# Patient Record
Sex: Male | Born: 1965 | Race: White | Hispanic: No | Marital: Married | Smoking: Never smoker
Health system: Southern US, Community
[De-identification: ages and names within clinical notes are randomized; demographics above are authoritative.]

## PROBLEM LIST (undated history)

## (undated) DIAGNOSIS — M549 Dorsalgia, unspecified: Secondary | ICD-10-CM

## (undated) HISTORY — DX: Dorsalgia, unspecified: M54.9

## (undated) HISTORY — PX: MOUTH SURGERY: SHX715

---

## 1998-12-25 ENCOUNTER — Encounter: Admission: RE | Admit: 1998-12-25 | Discharge: 1998-12-25 | Payer: Self-pay | Admitting: Neurological Surgery

## 1998-12-25 ENCOUNTER — Encounter: Payer: Self-pay | Admitting: Neurological Surgery

## 2005-10-08 ENCOUNTER — Encounter: Admission: RE | Admit: 2005-10-08 | Discharge: 2005-10-08 | Payer: Self-pay | Admitting: Orthopaedic Surgery

## 2009-12-28 ENCOUNTER — Emergency Department (HOSPITAL_BASED_OUTPATIENT_CLINIC_OR_DEPARTMENT_OTHER): Admission: EM | Admit: 2009-12-28 | Discharge: 2009-12-28 | Payer: Self-pay | Admitting: Emergency Medicine

## 2009-12-28 ENCOUNTER — Ambulatory Visit: Payer: Self-pay | Admitting: Interventional Radiology

## 2010-05-05 LAB — COMPREHENSIVE METABOLIC PANEL
ALT: 22 U/L (ref 0–53)
AST: 19 U/L (ref 0–37)
Albumin: 4 g/dL (ref 3.5–5.2)
Alkaline Phosphatase: 52 U/L (ref 39–117)
BUN: 18 mg/dL (ref 6–23)
CO2: 28 mEq/L (ref 19–32)
Calcium: 9.6 mg/dL (ref 8.4–10.5)
Chloride: 106 mEq/L (ref 96–112)
Creatinine, Ser: 1.1 mg/dL (ref 0.4–1.5)
GFR calc Af Amer: >60 mL/min (ref >60)
GFR calc non Af Amer: >60 mL/min (ref >60)
Glucose, Bld: 85 mg/dL (ref 70–99)
Potassium: 4.4 mEq/L (ref 3.5–5.1)
Sodium: 144 mEq/L (ref 135–145)
Total Bilirubin: 0.6 mg/dL (ref 0.3–1.2)
Total Protein: 6.8 g/dL (ref 6.0–8.3)

## 2010-05-05 LAB — POCT CARDIAC MARKERS
CKMB, poc: <1 ng/mL — ABNORMAL LOW (ref 1.0–8.0)
Myoglobin, poc: 36.4 ng/mL (ref 12–200)
Troponin i, poc: <0.05 ng/mL (ref 0.00–0.09)
Troponin i, poc: <0.05 ng/mL (ref 0.00–0.09)

## 2010-05-05 LAB — CBC
HCT: 41.2 % (ref 39.0–52.0)
Hemoglobin: 14.4 g/dL (ref 13.0–17.0)
MCH: 31 pg (ref 26.0–34.0)
MCHC: 34.9 g/dL (ref 30.0–36.0)
MCV: 88.8 fL (ref 78.0–100.0)
Platelets: 110 10*3/uL — ABNORMAL LOW (ref 150–400)
RBC: 4.65 MIL/uL (ref 4.22–5.81)
RDW: 12.4 % (ref 11.5–15.5)
WBC: 5.5 10*3/uL (ref 4.0–10.5)

## 2011-08-22 IMAGING — CR DG CHEST 2V
2 series · 2 of 2 positions shown · non-contrast
Comparison: None

CLINICAL DATA: Chest pain and left arm pain.

CHEST - 2 VIEW

[w chest pa]
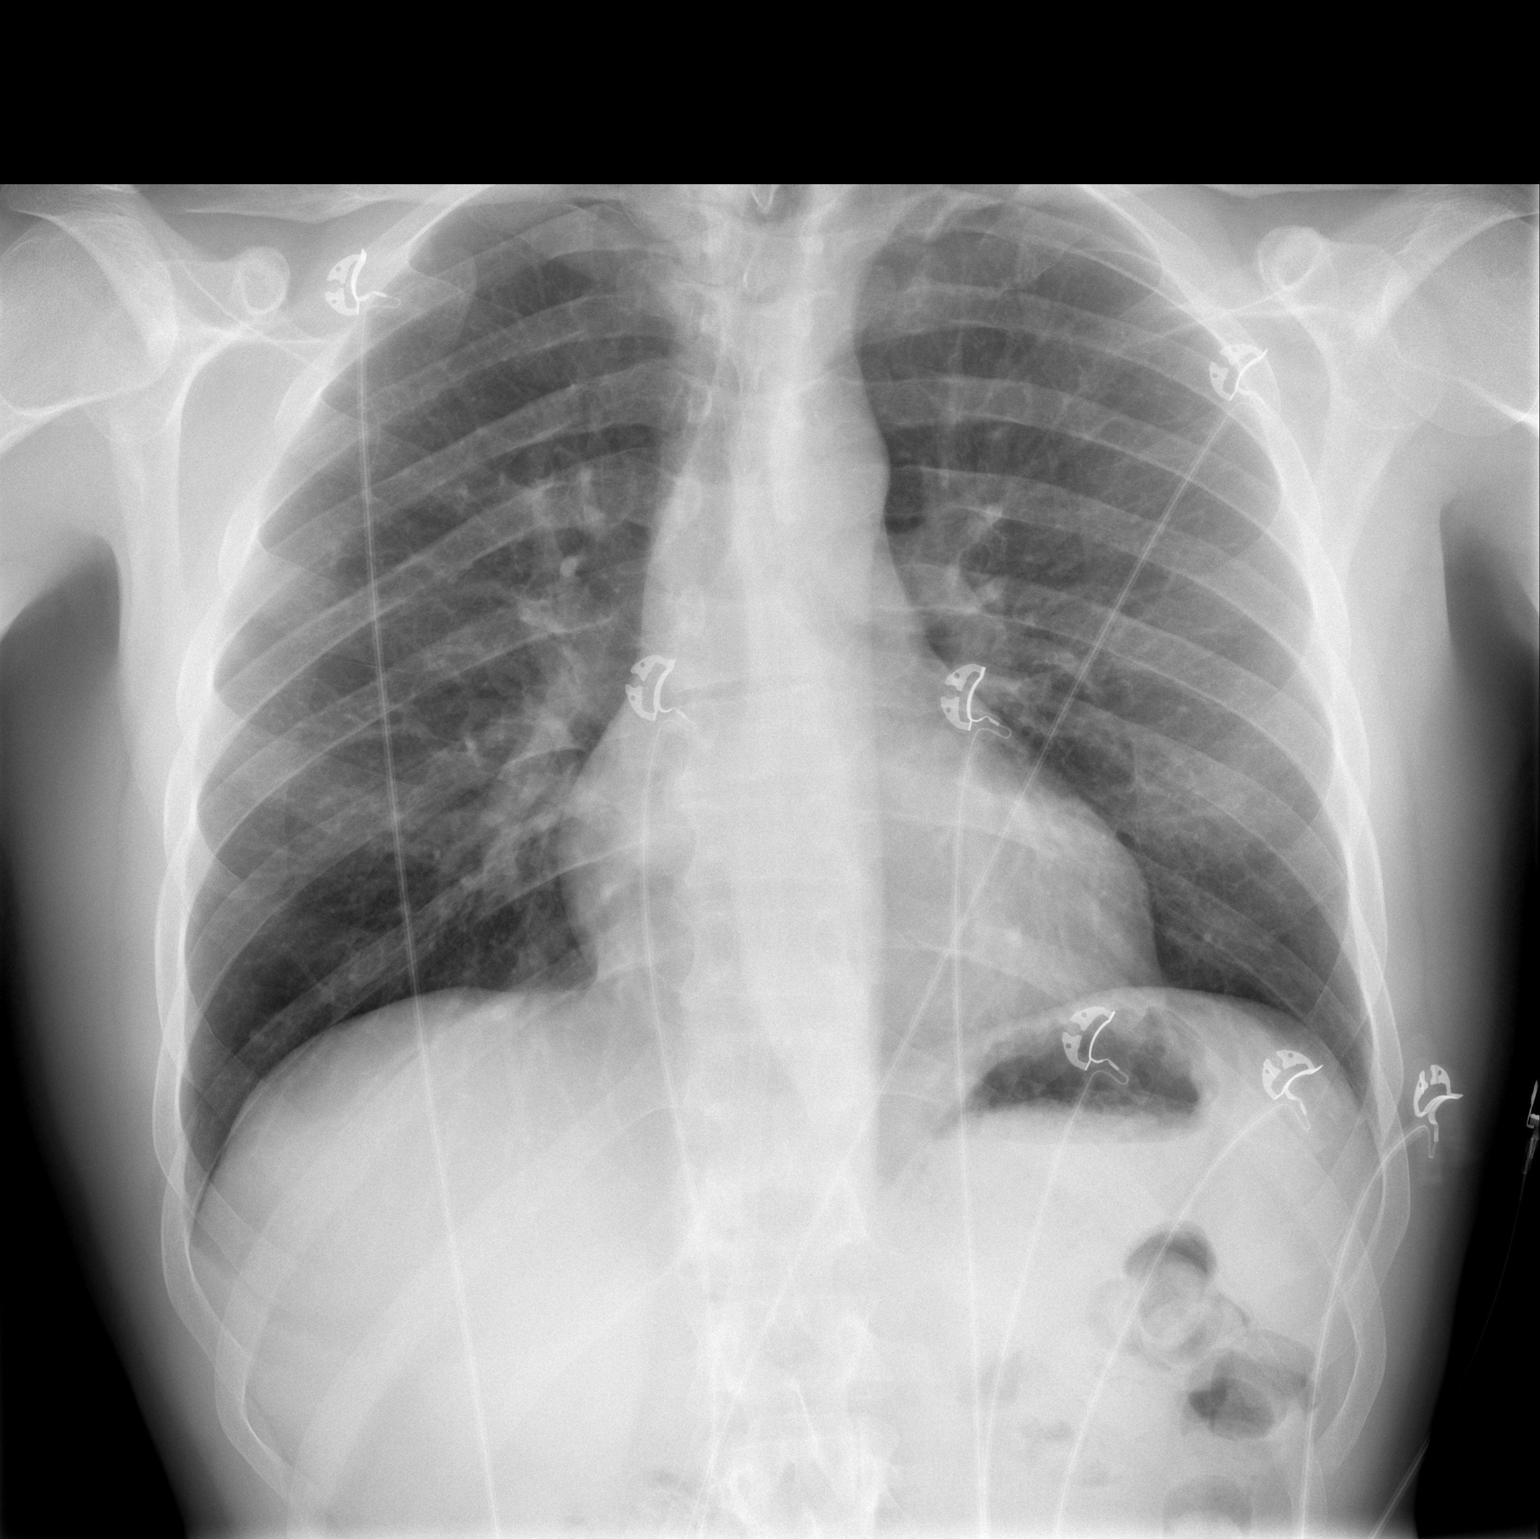

[w chest lat]
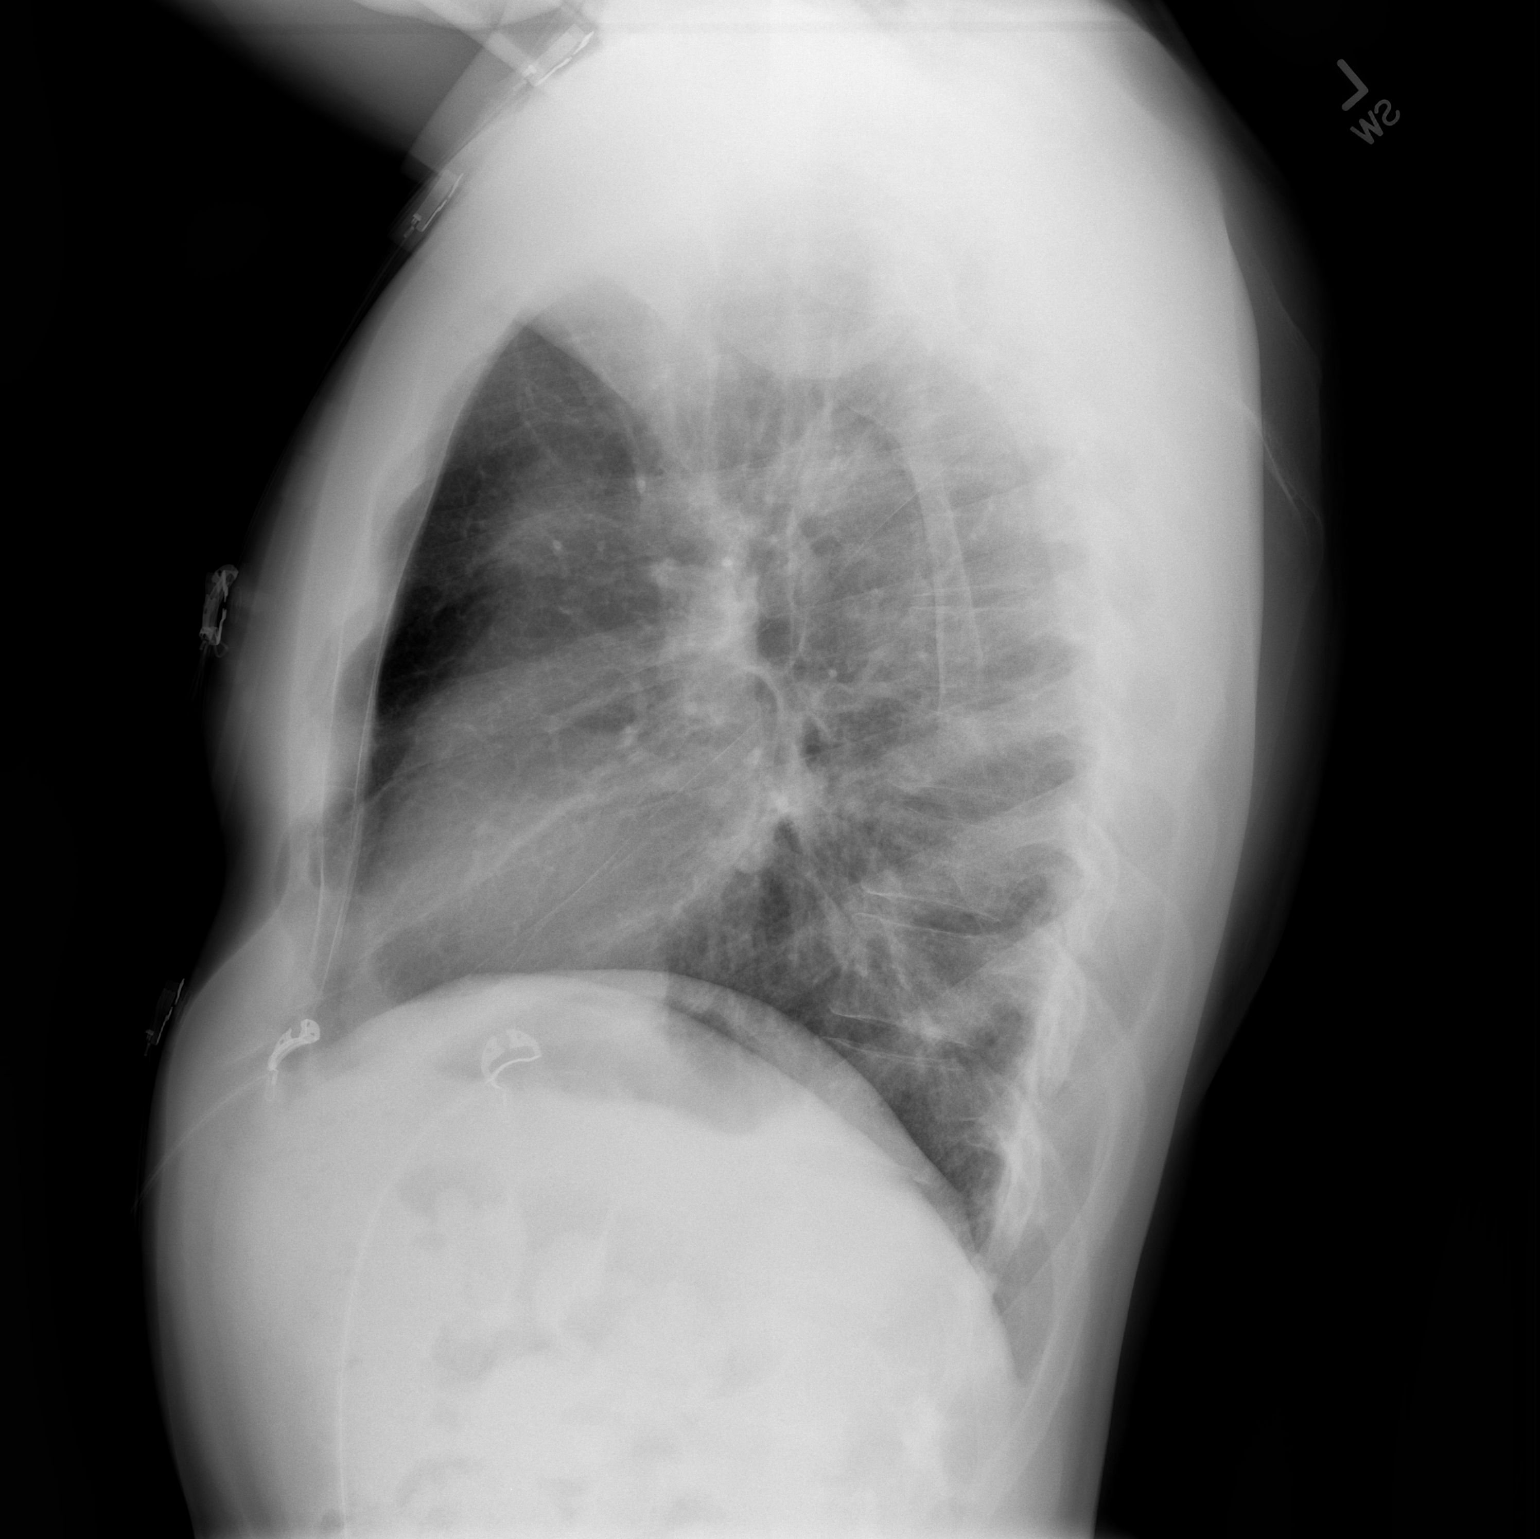

[2 of 2 positions shown; findings below may reference images not displayed]

FINDINGS: The heart size and mediastinal contours are within normal
limits.  Both lungs are clear.  The visualized skeletal structures
are unremarkable.
IMPRESSION: No active disease.

## 2016-06-30 ENCOUNTER — Ambulatory Visit (INDEPENDENT_AMBULATORY_CARE_PROVIDER_SITE_OTHER): Payer: BLUE CROSS/BLUE SHIELD | Admitting: Emergency Medicine

## 2016-06-30 ENCOUNTER — Encounter: Payer: Self-pay | Admitting: Emergency Medicine

## 2016-06-30 VITALS — BP 122/75 | HR 63 | Temp 98.3°F | Resp 17 | Ht 70.5 in | Wt 188.0 lb

## 2016-06-30 DIAGNOSIS — Z119 Encounter for screening for infectious and parasitic diseases, unspecified: Secondary | ICD-10-CM | POA: Insufficient documentation

## 2016-06-30 DIAGNOSIS — Z111 Encounter for screening for respiratory tuberculosis: Secondary | ICD-10-CM | POA: Insufficient documentation

## 2016-06-30 LAB — POC INFLUENZA A&B (BINAX/QUICKVUE)
INFLUENZA A, POC: NEGATIVE
Influenza B, POC: NEGATIVE

## 2016-06-30 NOTE — Patient Instructions (Signed)
     IF you received an x-ray today, you will receive an invoice from [facility]. Please contact [facility] at [phone] with questions or concerns regarding your invoice.   IF you received labwork today, you will receive an invoice from [facility]. Please contact [facility] at [phone] with questions or concerns regarding your invoice.   Our billing staff will not be able to assist you with questions regarding bills from these companies.  You will be contacted with the lab results as soon as they are available. The fastest way to get your results is to activate your My Chart account. Instructions are located on the last page of this paperwork. If you have not heard from us regarding the results in 2 weeks, please contact this office.     

## 2016-06-30 NOTE — Progress Notes (Signed)
David Adams 51 y.o.   Chief Complaint  Patient presents with  . 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 Asymptomatic and no medical concerns.  HPI   Prior to Admission medications   Not on File    Not on File  There are no active problems to display for this patient.   No past medical history on file.  No past surgical history on file.  Social History   Social History  . Marital status: Single    Spouse name: N/A  . Number of children: N/A  . Years of education: N/A   Occupational History  . Not on file.   Social History Main Topics  . Smoking status: Never Smoker  . Smokeless tobacco: Never Used  . Alcohol use No  . Drug use: No  . Sexual activity: No   Other Topics Concern  . Not on file   Social History Narrative  . No narrative on file    No family history on file.   Review of Systems  Constitutional: Negative.  Negative for chills, fever and weight loss.  Respiratory: Negative for cough.   Gastrointestinal: Negative for nausea and vomiting.  Skin: Negative for rash.  Neurological: Negative for dizziness and headaches.  All other systems reviewed and are negative.  Vitals:   06/30/16 1606  BP: 122/75  Pulse: 63  Resp: 17  Temp: 98.3 F (36.8 C)     Physical Exam  Constitutional: He is oriented to person, place, and time. He appears well-developed and well-nourished.  HENT:  Head: Normocephalic and atraumatic.  Eyes: EOM are normal. Pupils are equal, round, and reactive to light.  Neck: Normal range of motion.  Cardiovascular: Normal rate.   Pulmonary/Chest: Effort normal.  Musculoskeletal: Normal range of motion.  Neurological: He is alert and oriented to person, place, and time.  Skin: Skin is warm and dry.  Psychiatric: He has a normal mood and affect. His  behavior is normal.  Vitals reviewed.    ASSESSMENT & PLAN: Acel was seen today for needs tb screening and test for influenza.  Diagnoses and all orders for this visit:  Screening-pulmonary TB -     TB Skin Test  Screening examination for infectious disease -     POC Influenza A&B(BINAX/QUICKVUE)      Agustina Caroli, MD [facility]

## 2016-06-30 NOTE — Progress Notes (Signed)

## 2016-07-02 ENCOUNTER — Ambulatory Visit (INDEPENDENT_AMBULATORY_CARE_PROVIDER_SITE_OTHER): Payer: BLUE CROSS/BLUE SHIELD | Admitting: Physician Assistant

## 2016-07-02 DIAGNOSIS — Z111 Encounter for screening for respiratory tuberculosis: Secondary | ICD-10-CM

## 2016-07-02 LAB — TB SKIN TEST
Induration: 0 mm
TB Skin Test: NEGATIVE

## 2017-05-25 ENCOUNTER — Encounter: Payer: Self-pay | Admitting: Physician Assistant

## 2017-12-13 DIAGNOSIS — Z23 Encounter for immunization: Secondary | ICD-10-CM | POA: Diagnosis not present

## 2017-12-13 DIAGNOSIS — Z7189 Other specified counseling: Secondary | ICD-10-CM | POA: Diagnosis not present

## 2017-12-13 DIAGNOSIS — Z0000 Encounter for general adult medical examination without abnormal findings: Secondary | ICD-10-CM | POA: Diagnosis not present

## 2019-01-25 ENCOUNTER — Ambulatory Visit (INDEPENDENT_AMBULATORY_CARE_PROVIDER_SITE_OTHER): Payer: Self-pay | Admitting: Family Medicine

## 2019-01-25 ENCOUNTER — Encounter: Payer: Self-pay | Admitting: Gastroenterology

## 2019-01-25 ENCOUNTER — Other Ambulatory Visit: Payer: Self-pay

## 2019-01-25 ENCOUNTER — Encounter: Payer: Self-pay | Admitting: Family Medicine

## 2019-01-25 VITALS — BP 120/81 | HR 51 | Temp 98.2°F | Ht 69.5 in | Wt 197.2 lb

## 2019-01-25 DIAGNOSIS — Z23 Encounter for immunization: Secondary | ICD-10-CM | POA: Diagnosis not present

## 2019-01-25 DIAGNOSIS — H9313 Tinnitus, bilateral: Secondary | ICD-10-CM | POA: Diagnosis not present

## 2019-01-25 DIAGNOSIS — Z1211 Encounter for screening for malignant neoplasm of colon: Secondary | ICD-10-CM | POA: Diagnosis not present

## 2019-01-25 DIAGNOSIS — Z6828 Body mass index (BMI) 28.0-28.9, adult: Secondary | ICD-10-CM

## 2019-01-25 DIAGNOSIS — E663 Overweight: Secondary | ICD-10-CM

## 2019-01-25 DIAGNOSIS — Z125 Encounter for screening for malignant neoplasm of prostate: Secondary | ICD-10-CM | POA: Diagnosis not present

## 2019-01-25 DIAGNOSIS — Z0001 Encounter for general adult medical examination with abnormal findings: Secondary | ICD-10-CM

## 2019-01-25 LAB — CBC
HCT: 44.3 % (ref 39.0–52.0)
Hemoglobin: 14.8 g/dL (ref 13.0–17.0)
MCHC: 33.4 g/dL (ref 30.0–36.0)
MCV: 89.1 fl (ref 78.0–100.0)
Platelets: 140 10*3/uL — ABNORMAL LOW (ref 150.0–400.0)
RBC: 4.98 Mil/uL (ref 4.22–5.81)
RDW: 13.5 % (ref 11.5–15.5)
WBC: 5 10*3/uL (ref 4.0–10.5)

## 2019-01-25 LAB — LIPID PANEL
Cholesterol: 176 mg/dL (ref 0–200)
HDL: 53.3 mg/dL (ref >39.00)
LDL Cholesterol: 104 mg/dL — ABNORMAL HIGH (ref 0–99)
NonHDL: 122.7
Total CHOL/HDL Ratio: 3
Triglycerides: 93 mg/dL (ref 0.0–149.0)
VLDL: 18.6 mg/dL (ref 0.0–40.0)

## 2019-01-25 LAB — SARS-COV-2 IGG: SARS-COV-2 IgG: 0.04

## 2019-01-25 LAB — COMPREHENSIVE METABOLIC PANEL
ALT: 23 U/L (ref 0–53)
AST: 16 U/L (ref 0–37)
Albumin: 4.4 g/dL (ref 3.5–5.2)
Alkaline Phosphatase: 58 U/L (ref 39–117)
BUN: 16 mg/dL (ref 6–23)
CO2: 31 mEq/L (ref 19–32)
Calcium: 11.7 mg/dL — ABNORMAL HIGH (ref 8.4–10.5)
Chloride: 104 mEq/L (ref 96–112)
Creatinine, Ser: 1.08 mg/dL (ref 0.40–1.50)
GFR: 71.02 mL/min (ref >60.00)
Glucose, Bld: 92 mg/dL (ref 70–99)
Potassium: 4.8 mEq/L (ref 3.5–5.1)
Sodium: 141 mEq/L (ref 135–145)
Total Bilirubin: 0.4 mg/dL (ref 0.2–1.2)
Total Protein: 6.6 g/dL (ref 6.0–8.3)

## 2019-01-25 LAB — TSH: TSH: 2.56 u[IU]/mL (ref 0.35–4.50)

## 2019-01-25 LAB — PSA: PSA: 2.57 ng/mL (ref 0.10–4.00)

## 2019-01-25 NOTE — Patient Instructions (Signed)
It was very nice to see you today!  You have tinnitus. Please let me know when you would like referral to an audiologist.  We will give you a flu vaccine today.  We will check blood work today.  Come back in 1 year for your next physical, or sooner if needed.  Take care, Dr Jerline Pain  Please try these tips to maintain a healthy lifestyle:   Eat at least 3 REAL meals and 1-2 snacks per day.  Aim for no more than 5 hours between eating.  If you eat breakfast, please do so within one hour of getting up.    Obtain twice as many fruits/vegetables as protein or carbohydrate foods for both lunch and dinner. (Half of each meal should be fruits/vegetables, one quarter protein, and one quarter starchy carbs)   Cut down on sweet beverages. This includes juice, soda, and sweet tea.    Exercise at least 150 minutes every week.    Tinnitus Tinnitus refers to hearing a sound when there is no actual source for that sound. This is often described as ringing in the ears. However, people with this condition may hear a variety of noises, in one ear or in both ears. The sounds of tinnitus can be soft, loud, or somewhere in between. Tinnitus can last for a few seconds or can be constant for days. It may go away without treatment and come back at various times. When tinnitus is constant or happens often, it can lead to other problems, such as trouble sleeping and trouble concentrating. Almost everyone experiences tinnitus at some point. Tinnitus that is long-lasting (chronic) or comes back often (recurs) may require medical attention. What are the causes? The cause of tinnitus is often not known. In some cases, it can result from other problems or conditions, including:  Exposure to loud noises from machinery, music, or other sources.  Hearing loss.  Ear or sinus infections.  Earwax buildup.  An object (foreign body) stuck in the ear.  Taking certain medicines.  Drinking alcohol or caffeine.   High blood pressure.  Heart diseases.  Anemia.  Allergies.  Meniere's disease.  Thyroid problems.  Tumors.  A weak, bulging blood vessel (aneurysm) near the ear.  Depression or other mood disorders. What are the signs or symptoms? The main symptom of tinnitus is hearing a sound when there is no source for that sound. It may sound like:  Buzzing.  Roaring.  Ringing.  Blowing air, like the sound heard when you listen to a seashell.  Hissing.  Whistling.  Sizzling.  Humming.  Running water.  A musical note.  Tapping. Symptoms may affect only one ear (unilateral) or both ears (bilateral). How is this diagnosed? Tinnitus is diagnosed based on your symptoms, your medical history, and a physical exam. Your health care provider may do a thorough hearing test (audiologic exam) if your tinnitus:  Is unilateral.  Causes hearing difficulties.  Lasts 6 months or longer. You may work with a health care provider who specializes in hearing disorders (audiologist). You may be asked questions about your symptoms and how they affect your daily life. You may have other tests done, such as:  CT scan.  MRI.  An imaging test of how blood flows through your blood vessels (angiogram). How is this treated? Treating an underlying medical condition can sometimes make tinnitus go away. If your tinnitus continues, other treatments may include:  Medicines, such as antidepressants or sleeping aids.  Sound generators to mask the tinnitus.  These include: ? Tabletop sound machines that play relaxing sounds to help you fall asleep. ? Wearable devices that fit in your ear and play sounds or music. ? Acoustic neural stimulation. This involves using headphones to listen to music that contains an auditory signal. Over time, listening to this signal may change some pathways in your brain and make you less sensitive to tinnitus. This treatment is used for very severe cases when no other  treatment is working.  Therapy and counseling to help you manage the stress of living with tinnitus.  Using hearing aids or cochlear implants if your tinnitus is related to hearing loss. Hearing aids are worn in the outer ear. Cochlear implants are surgically placed in the inner ear. Follow these instructions at home: Managing symptoms      When possible, avoid being in loud places and being exposed to loud sounds.  Wear hearing protection, such as earplugs, when you are exposed to loud noises.  Use a white noise machine, a humidifier, or other devices to mask the sound of tinnitus.  Practice techniques for reducing stress, such as meditation, yoga, or deep breathing. Work with your health care provider if you need help with managing stress.  Sleep with your head slightly raised. This may reduce the impact of tinnitus. General instructions  Do not use stimulants, such as nicotine, alcohol, or caffeine. Talk with your health care provider about other stimulants to avoid. Stimulants are substances that can make you feel alert and attentive by increasing certain activities in the body (such as heart rate and blood pressure). These substances may make tinnitus worse.  Take over-the-counter and prescription medicines only as told by your health care provider.  Try to get plenty of sleep each night.  Keep all follow-up visits as told by your health care provider. This is important. Contact a health care provider if:  Your tinnitus continues for 3 weeks or longer without stopping.  Your symptoms get worse or do not get better with home care.  You develop tinnitus after a head injury.  You have tinnitus along with any of the following: ? Dizziness. ? Loss of balance. ? Nausea and vomiting. Summary  Tinnitus refers to hearing a sound when there is no actual source for that sound. This is often described as ringing in the ears.  Symptoms may affect only one ear (unilateral) or both  ears (bilateral).  Use a white noise machine, a humidifier, or other devices to mask the sound of tinnitus.  Do not use stimulants, such as nicotine, alcohol, or caffeine. Talk with your health care provider about other stimulants to avoid. These substances may make tinnitus worse. This information is not intended to replace advice given to you by your health care provider. Make sure you discuss any questions you have with your health care provider. Document Released: 02/08/2005 Document Revised: 01/21/2017 Document Reviewed: 11/18/2016 Elsevier Patient Education  2020 Elsevier Inc.    Preventive Care 40-91 Years Old, Male Preventive care refers to lifestyle choices and visits with your health care provider that can promote health and wellness. This includes:  A yearly physical exam. This is also called an annual well check.  Regular dental and eye exams.  Immunizations.  Screening for certain conditions.  Healthy lifestyle choices, such as eating a healthy diet, getting regular exercise, not using drugs or products that contain nicotine and tobacco, and limiting alcohol use. What can I expect for my preventive care visit? Physical exam Your health care provider  will check:  Height and weight. These may be used to calculate body mass index (BMI), which is a measurement that tells if you are at a healthy weight.  Heart rate and blood pressure.  Your skin for abnormal spots. Counseling Your health care provider may ask you questions about:  Alcohol, tobacco, and drug use.  Emotional well-being.  Home and relationship well-being.  Sexual activity.  Eating habits.  Work and work Statistician. What immunizations do I need?  Influenza (flu) vaccine  This is recommended every year. Tetanus, diphtheria, and pertussis (Tdap) vaccine  You may need a Td booster every 10 years. Varicella (chickenpox) vaccine  You may need this vaccine if you have not already been  vaccinated. Zoster (shingles) vaccine  You may need this after age 67. Measles, mumps, and rubella (MMR) vaccine  You may need at least one dose of MMR if you were born in 1957 or later. You may also need a second dose. Pneumococcal conjugate (PCV13) vaccine  You may need this if you have certain conditions and were not previously vaccinated. Pneumococcal polysaccharide (PPSV23) vaccine  You may need one or two doses if you smoke cigarettes or if you have certain conditions. Meningococcal conjugate (MenACWY) vaccine  You may need this if you have certain conditions. Hepatitis A vaccine  You may need this if you have certain conditions or if you travel or work in places where you may be exposed to hepatitis A. Hepatitis B vaccine  You may need this if you have certain conditions or if you travel or work in places where you may be exposed to hepatitis B. Haemophilus influenzae type b (Hib) vaccine  You may need this if you have certain risk factors. Human papillomavirus (HPV) vaccine  If recommended by your health care provider, you may need three doses over 6 months. You may receive vaccines as individual doses or as more than one vaccine together in one shot (combination vaccines). Talk with your health care provider about the risks and benefits of combination vaccines. What tests do I need? Blood tests  Lipid and cholesterol levels. These may be checked every 5 years, or more frequently if you are over 41 years old.  Hepatitis C test.  Hepatitis B test. Screening  Lung cancer screening. You may have this screening every year starting at age 41 if you have a 30-pack-year history of smoking and currently smoke or have quit within the past 15 years.  Prostate cancer screening. Recommendations will vary depending on your family history and other risks.  Colorectal cancer screening. All adults should have this screening starting at age 74 and continuing until age 56. Your  health care provider may recommend screening at age 81 if you are at increased risk. You will have tests every 1-10 years, depending on your results and the type of screening test.  Diabetes screening. This is done by checking your blood sugar (glucose) after you have not eaten for a while (fasting). You may have this done every 1-3 years.  Sexually transmitted disease (STD) testing. Follow these instructions at home: Eating and drinking  Eat a diet that includes fresh fruits and vegetables, whole grains, lean protein, and low-fat dairy products.  Take vitamin and mineral supplements as recommended by your health care provider.  Do not drink alcohol if your health care provider tells you not to drink.  If you drink alcohol: ? Limit how much you have to 0-2 drinks a day. ? Be aware of how much  alcohol is in your drink. In the [place] one drink equals one 12 oz bottle of beer (355 mL), one 5 oz glass of wine (148 mL), or one 1 oz glass of hard liquor (44 mL). Lifestyle  Take daily care of your teeth and gums.  Stay active. Exercise for at least 30 minutes on 5 or more days each week.  Do not use any products that contain nicotine or tobacco, such as cigarettes, e-cigarettes, and chewing tobacco. If you need help quitting, ask your health care provider.  If you are sexually active, practice safe sex. Use a condom or other form of protection to prevent STIs (sexually transmitted infections).  Talk with your health care provider about taking a low-dose aspirin every day starting at age 63. What's next?  Go to your health care provider once a year for a well check visit.  Ask your health care provider how often you should have your eyes and teeth checked.  Stay up to date on all vaccines. This information is not intended to replace advice given to you by your health care provider. Make sure you discuss any questions you have with your health care provider. Document Released: 03/07/2015  Document Revised: 02/02/2018 Document Reviewed: 02/02/2018 Elsevier Patient Education  2020 [employer].

## 2019-01-25 NOTE — Progress Notes (Signed)
Chief Complaint:  David Adams is a 53 y.o. male who presents today for his annual comprehensive physical exam and to establish care.   Assessment/Plan:  Tinnitus No red flags. Recommended white noise machine. Offered referral to audiology, however patient declined.   Body mass index is 28.7 kg/m. / Overweight BMI Metric Follow Up - 01/25/19 1010      BMI Metric Follow Up-Please document annually   BMI Metric Follow Up  Education provided       Preventative Healthcare: Referral placed for colonoscopy. Check PSA, CBC, C met, TSH, and lipid panel.  Flu vaccine given today.  Patient Counseling(The following topics were reviewed and/or handout was given):  -Nutrition: Stressed importance of moderation in sodium/caffeine intake, saturated fat and cholesterol, caloric balance, sufficient intake of fresh fruits, vegetables, and fiber.  -Stressed the importance of regular exercise.   -Substance Abuse: Discussed cessation/primary prevention of tobacco, alcohol, or other drug use; driving or other dangerous activities under the influence; availability of treatment for abuse.   -Injury prevention: Discussed safety belts, safety helmets, smoke detector, smoking near bedding or upholstery.   -Sexuality: Discussed sexually transmitted diseases, partner selection, use of condoms, avoidance of unintended pregnancy and contraceptive alternatives.   -Dental health: Discussed importance of regular tooth brushing, flossing, and dental visits.  -Health maintenance and immunizations reviewed. Please refer to Health maintenance section.  Return to care in 1 year for next preventative visit.     Subjective:  HPI:  He has no acute complaints today.   He has had some ringing in the ears for the past few years.  No noted hearing loss.  No dizziness.  Lifestyle Diet: No diets or eating plans.  Exercise: Runs with daughter. 2-3 times per week.   Depression screen [facility] 2/9 01/25/2019  Decreased  Interest 0  Down, Depressed, Hopeless 0  PHQ - 2 Score 0  Altered sleeping 0  Tired, decreased energy 0  Change in appetite 0  Feeling bad or failure about yourself  0  Trouble concentrating 0  Moving slowly or fidgety/restless 0  Suicidal thoughts 0  PHQ-9 Score 0    Health Maintenance Due  Topic Date Due  . HIV Screening  03/07/1979  . COLONOSCOPY  03/06/2014  . INFLUENZA VACCINE  09/23/2018     ROS: Per HPI, otherwise a complete review of systems was negative.   PMH:  The following were reviewed and entered/updated in epic: Past Medical History:  Diagnosis Date  . Back pain    Patient Active Problem List   Diagnosis Date Noted  . Screening-pulmonary TB 06/30/2016  . Screening examination for infectious disease 06/30/2016   History reviewed. No pertinent surgical history.  Family History  Problem Relation Age of Onset  . Cancer Sister   . Cancer Paternal Grandmother   . Prostate cancer Neg Hx   . Colon cancer Neg Hx     Medications- reviewed and updated No current outpatient medications on file.   No current facility-administered medications for this visit.     Allergies-reviewed and updated Allergies  Allergen Reactions  . Peanut-Containing Drug Products     Social History   Socioeconomic History  . Marital status: Married    Spouse name: Not on file  . Number of children: Not on file  . Years of education: Not on file  . Highest education level: Not on file  Occupational History  . Not on file  Social Needs  . Financial resource strain: Not on file  .  Food insecurity    Worry: Not on file    Inability: Not on file  . Transportation needs    Medical: Not on file    Non-medical: Not on file  Tobacco Use  . Smoking status: Never Smoker  . Smokeless tobacco: Never Used  Substance and Sexual Activity  . Alcohol use: No    Frequency: Never  . Drug use: No  . Sexual activity: Never  Lifestyle  . Physical activity    Days per week: Not  on file    Minutes per session: Not on file  . Stress: Not on file  Relationships  . Social Herbalist on phone: Not on file    Gets together: Not on file    Attends religious service: Not on file    Active member of club or organization: Not on file    Attends meetings of clubs or organizations: Not on file    Relationship status: Not on file  Other Topics Concern  . Not on file  Social History Narrative  . Not on file        Objective:  Physical Exam: BP 120/81   Pulse (!) 51   Temp 98.2 F (36.8 C)   Ht 5' 9.5" (1.765 m)   Wt 197 lb 3.2 oz (89.4 kg)   SpO2 99%   BMI 28.70 kg/m   Body mass index is 28.7 kg/m. Wt Readings from Last 3 Encounters:  01/25/19 197 lb 3.2 oz (89.4 kg)  06/30/16 188 lb (85.3 kg)   Gen: NAD, resting comfortably HEENT: TMs normal bilaterally. OP clear. No thyromegaly noted.  CV: RRR with no murmurs appreciated Pulm: NWOB, CTAB with no crackles, wheezes, or rhonchi GI: Normal bowel sounds present. Soft, Nontender, Nondistended. MSK: no edema, cyanosis, or clubbing noted Skin: warm, dry Neuro: CN2-12 grossly intact. Strength 5/5 in upper and lower extremities. Reflexes symmetric and intact bilaterally.  Psych: Normal affect and thought content     Caleb M. Jerline Pain, MD 01/25/2019 10:12 AM

## 2019-01-29 ENCOUNTER — Encounter: Payer: Self-pay | Admitting: Family Medicine

## 2019-01-29 DIAGNOSIS — E785 Hyperlipidemia, unspecified: Secondary | ICD-10-CM | POA: Insufficient documentation

## 2019-01-29 NOTE — Progress Notes (Signed)
Please inform patient of the following:  HIs "bad" cholesterol was just a little high, but everything else is NORMAL. No need to start any medications. Would like for him to keep up the good work and we can recheck in a year or so.  David Adams. Jerline Pain, MD 01/29/2019 12:44 PM

## 2019-02-28 ENCOUNTER — Other Ambulatory Visit: Payer: Self-pay

## 2019-02-28 ENCOUNTER — Encounter: Payer: Self-pay | Admitting: Gastroenterology

## 2019-02-28 ENCOUNTER — Ambulatory Visit (AMBULATORY_SURGERY_CENTER): Payer: BLUE CROSS/BLUE SHIELD | Admitting: *Deleted

## 2019-02-28 VITALS — Temp 96.8°F | Ht 69.5 in | Wt 200.8 lb

## 2019-02-28 DIAGNOSIS — Z1211 Encounter for screening for malignant neoplasm of colon: Secondary | ICD-10-CM

## 2019-02-28 DIAGNOSIS — Z1159 Encounter for screening for other viral diseases: Secondary | ICD-10-CM

## 2019-02-28 MED ORDER — SUPREP BOWEL PREP KIT 17.5-3.13-1.6 GM/177ML PO SOLN: 1.0000 | Freq: Once | ORAL | 0 refills | Status: AC

## 2019-02-28 NOTE — Progress Notes (Signed)
No egg or soy allergy known to patient  No issues with past sedation with any surgeries  or procedures, no intubation problems  No diet pills per patient No home 02 use per patient  No blood thinners per patient  Pt denies issues with constipation  No A fib or A flutter  EMMI video sent to pt's e mail  Suprep $15 coupon   Due to the COVID-19 pandemic we are asking patients to follow these guidelines. Please only bring one care partner. Please be aware that your care partner may wait in the car in the parking lot or if they feel like they will be too hot to wait in the car, they may wait in the lobby on the 4th floor. All care partners are required to wear a mask the entire time (we do not have any that we can provide them), they need to practice social distancing, and we will do a Covid check for all patient's and care partners when you arrive. Also we will check their temperature and your temperature. If the care partner waits in their car they need to stay in the parking lot the entire time and we will call them on their cell phone when the patient is ready for discharge so they can bring the car to the front of the building. Also all patient's will need to wear a mask into building.

## 2019-03-14 ENCOUNTER — Encounter: Payer: BLUE CROSS/BLUE SHIELD | Admitting: Gastroenterology

## 2019-04-09 ENCOUNTER — Ambulatory Visit (INDEPENDENT_AMBULATORY_CARE_PROVIDER_SITE_OTHER): Payer: BLUE CROSS/BLUE SHIELD

## 2019-04-09 ENCOUNTER — Other Ambulatory Visit: Payer: Self-pay | Admitting: Gastroenterology

## 2019-04-09 DIAGNOSIS — Z1159 Encounter for screening for other viral diseases: Secondary | ICD-10-CM | POA: Diagnosis not present

## 2019-04-09 LAB — SARS CORONAVIRUS 2 (TAT 6-24 HRS): SARS Coronavirus 2: NEGATIVE

## 2019-04-11 ENCOUNTER — Other Ambulatory Visit: Payer: Self-pay

## 2019-04-11 ENCOUNTER — Encounter: Payer: Self-pay | Admitting: Gastroenterology

## 2019-04-11 ENCOUNTER — Ambulatory Visit (AMBULATORY_SURGERY_CENTER): Payer: BLUE CROSS/BLUE SHIELD | Admitting: Gastroenterology

## 2019-04-11 VITALS — BP 104/71 | HR 61 | Temp 96.9°F | Resp 12 | Ht 69.5 in | Wt 200.8 lb

## 2019-04-11 DIAGNOSIS — Z1211 Encounter for screening for malignant neoplasm of colon: Secondary | ICD-10-CM | POA: Diagnosis not present

## 2019-04-11 DIAGNOSIS — K635 Polyp of colon: Secondary | ICD-10-CM

## 2019-04-11 DIAGNOSIS — D125 Benign neoplasm of sigmoid colon: Secondary | ICD-10-CM

## 2019-04-11 DIAGNOSIS — D120 Benign neoplasm of cecum: Secondary | ICD-10-CM | POA: Diagnosis not present

## 2019-04-11 MED ORDER — SODIUM CHLORIDE 0.9 % IV SOLN: 500.0000 mL | Freq: Once | INTRAVENOUS | Status: AC

## 2019-04-11 NOTE — Progress Notes (Signed)
Called to room to assist during endoscopic procedure.  Patient ID and intended procedure confirmed with present staff. Received instructions for my participation in the procedure from the performing physician.  

## 2019-04-11 NOTE — Patient Instructions (Signed)
YOU HAD AN ENDOSCOPIC PROCEDURE TODAY AT THE [facility]:   Refer to the procedure report that was given to you for any specific questions about what was found during the examination.  If the procedure report does not answer your questions, please call your gastroenterologist to clarify.  If you requested that your care partner not be given the details of your procedure findings, then the procedure report has been included in a sealed envelope for you to review at your convenience later.  YOU SHOULD EXPECT: Some feelings of bloating in the abdomen. Passage of more gas than usual.  Walking can help get rid of the air that was put into your GI tract during the procedure and reduce the bloating. If you had a lower endoscopy (such as a colonoscopy or flexible sigmoidoscopy) you may notice spotting of blood in your stool or on the toilet paper. If you underwent a bowel prep for your procedure, you may not have a normal bowel movement for a few days.  Please Note:  You might notice some irritation and congestion in your nose or some drainage.  This is from the oxygen used during your procedure.  There is no need for concern and it should clear up in a day or so.  SYMPTOMS TO REPORT IMMEDIATELY:   Following lower endoscopy (colonoscopy or flexible sigmoidoscopy):  Excessive amounts of blood in the stool  Significant tenderness or worsening of abdominal pains  Swelling of the abdomen that is new, acute  Fever of 100F or higher   For urgent or emergent issues, a gastroenterologist can be reached at any hour by calling [phone].   DIET:  We do recommend a small meal at first, but then you may proceed to your regular diet.  Drink plenty of fluids but you should avoid alcoholic beverages for 24 hours.  MEDICATIONS: Continue present medications.  Please see handouts given to you by your recovery nurse.  ACTIVITY:  You should plan to take it easy for the rest of today and you should  NOT DRIVE or use heavy machinery until tomorrow (because of the sedation medicines used during the test).    FOLLOW UP: Our staff will call the number listed on your records 48-72 hours following your procedure to check on you and address any questions or concerns that you may have regarding the information given to you following your procedure. If we do not reach you, we will leave a message.  We will attempt to reach you two times.  During this call, we will ask if you have developed any symptoms of COVID 19. If you develop any symptoms (ie: fever, flu-like symptoms, shortness of breath, cough etc.) before then, please call [phone].  If you test positive for Covid 19 in the 2 weeks post procedure, please call and report this information to us.    If any biopsies were taken you will be contacted by phone or by letter within the next 1-3 weeks.  Please call us at [phone] if you have not heard about the biopsies in 3 weeks.   Thank you for allowing us to provide for your healthcare needs today.   SIGNATURES/CONFIDENTIALITY: You and/or your care partner have signed paperwork which will be entered into your electronic medical record.  These signatures attest to the fact that that the information above on your After Visit Summary has been reviewed and is understood.  Full responsibility of the confidentiality of this discharge information lies with you and/or   your care-partner. 

## 2019-04-11 NOTE — Op Note (Signed)
[facility] Patient Name: David Adams Procedure Date: 04/11/2019 9:33 AM MRN: [id number] Endoscopist: Mallie Mussel L. Loletha Carrow , MD Age: 54 Referring MD:  Date of Birth: [birth year] Gender: Male Account #: [id number] Procedure:                Colonoscopy Indications:              Screening for colorectal malignant neoplasm, This                            is the patient's first colonoscopy Medicines:                Monitored Anesthesia Care Procedure:                Pre-Anesthesia Assessment:                           - Prior to the procedure, a History and Physical                            was performed, and patient medications and                            allergies were reviewed. The patient's tolerance of                            previous anesthesia was also reviewed. The risks                            and benefits of the procedure and the sedation                            options and risks were discussed with the patient.                            All questions were answered, and informed consent                            was obtained. Prior Anticoagulants: The patient has                            taken no previous anticoagulant or antiplatelet                            agents. ASA Grade Assessment: I - A normal, healthy                            patient. After reviewing the risks and benefits,                            the patient was deemed in satisfactory condition to                            undergo the procedure.  After obtaining informed consent, the colonoscope                            was passed under direct vision. Throughout the                            procedure, the patient's blood pressure, pulse, and                            oxygen saturations were monitored continuously. The                            Colonoscope was introduced through the anus and                            advanced to the the cecum, identified by                             appendiceal orifice and ileocecal valve. The                            colonoscopy was performed without difficulty. The                            patient tolerated the procedure well. The quality                            of the bowel preparation was good. The ileocecal                            valve, appendiceal orifice, and rectum were                            photographed. The quality of the bowel preparation                            was evaluated using the BBPS [facility] Bowel                            Preparation Scale) with scores of: Right Colon = 2,                            Transverse Colon = 2 and Left Colon = 2. The total                            BBPS score equals 6. The bowel preparation used was                            SUPREP. Scope In: 10:02:25 AM Scope Out: 10:25:18 AM Scope Withdrawal Time: 0 hours 17 minutes 52 seconds  Total Procedure Duration: 0 hours 22 minutes 53 seconds  Findings:                 The perianal and digital rectal  examinations were                            normal.                           A few diverticula were found in the right colon.                           A 10 mm polyp was found in the cecum. The polyp was                            semi-sessile. The polyp was removed with a                            piecemeal technique using a cold snare. Resection                            and retrieval were complete.                           A diminutive polyp was found in the sigmoid colon.                            The polyp was sessile. The polyp was removed with a                            cold snare. Resection and retrieval were complete.                           The exam was otherwise without abnormality on                            direct and retroflexion views. Complications:            No immediate complications. Estimated Blood Loss:     Estimated blood loss was minimal. Impression:                - Diverticulosis in the right colon.                           - One 10 mm polyp in the cecum, removed piecemeal                            using a cold snare. Resected and retrieved.                           - One diminutive polyp in the sigmoid colon,                            removed with a cold snare. Resected and retrieved.                           - The examination was otherwise normal on direct  and retroflexion views. Recommendation:           - Patient has a contact number available for                            emergencies. The signs and symptoms of potential                            delayed complications were discussed with the                            patient. Return to normal activities tomorrow.                            Written discharge instructions were provided to the                            patient.                           - Resume previous diet.                           - Continue present medications.                           - Await pathology results.                           - Repeat colonoscopy is recommended for                            surveillance. The colonoscopy date will be                            determined after pathology results from today's                            exam become available for review. Harlan Vinal L. Loletha Carrow, MD 04/11/2019 10:30:01 AM This report has been signed electronically.

## 2019-04-11 NOTE — Progress Notes (Signed)
Temp by JB, Vitals by DT   Pt's states no medical or surgical changes since previsit or office visit. 

## 2019-04-11 NOTE — Progress Notes (Signed)
To PACU, VSS. Report to [email or web address] 

## 2019-04-13 ENCOUNTER — Telehealth: Payer: Self-pay | Admitting: *Deleted

## 2019-04-13 NOTE — Telephone Encounter (Signed)
No answer for post procedure call back. Left message and will call patient back later today.

## 2019-04-13 NOTE — Telephone Encounter (Signed)
  No answer at number given.  LM on VM.

## 2019-04-19 ENCOUNTER — Encounter: Payer: Self-pay | Admitting: Gastroenterology

## 2019-04-24 ENCOUNTER — Ambulatory Visit: Payer: BLUE CROSS/BLUE SHIELD

## 2019-04-24 ENCOUNTER — Ambulatory Visit (INDEPENDENT_AMBULATORY_CARE_PROVIDER_SITE_OTHER): Payer: BLUE CROSS/BLUE SHIELD

## 2019-04-24 ENCOUNTER — Other Ambulatory Visit: Payer: Self-pay

## 2019-04-24 DIAGNOSIS — Z111 Encounter for screening for respiratory tuberculosis: Secondary | ICD-10-CM | POA: Diagnosis not present

## 2019-04-26 ENCOUNTER — Other Ambulatory Visit: Payer: Self-pay

## 2019-04-26 ENCOUNTER — Ambulatory Visit: Payer: BLUE CROSS/BLUE SHIELD

## 2019-04-26 ENCOUNTER — Encounter: Payer: Self-pay | Admitting: Family Medicine

## 2019-04-26 DIAGNOSIS — Z111 Encounter for screening for respiratory tuberculosis: Secondary | ICD-10-CM

## 2019-04-26 LAB — TB SKIN TEST
Induration: NEGATIVE mm
TB Skin Test: NEGATIVE

## 2019-04-26 NOTE — Progress Notes (Signed)
Pt has TB read today in office.

## 2019-04-28 ENCOUNTER — Ambulatory Visit: Payer: BLUE CROSS/BLUE SHIELD

## 2019-04-28 ENCOUNTER — Ambulatory Visit: Payer: BC Managed Care – PPO | Attending: Internal Medicine

## 2019-04-28 DIAGNOSIS — Z23 Encounter for immunization: Secondary | ICD-10-CM | POA: Insufficient documentation

## 2019-04-28 NOTE — Progress Notes (Signed)
   Covid-19 Vaccination Clinic  Name:  David Adams    MRN: [id number] DOB: November 04, 1965  04/28/2019  [name] was observed post Covid-19 immunization for 15 minutes without incident. He was provided with Vaccine Information Sheet and instruction to access the V-Safe system.   [name] was instructed to call 911 with any severe reactions post vaccine: Marland Kitchen Difficulty breathing  . Swelling of face and throat  . A fast heartbeat  . A bad rash all over body  . Dizziness and weakness   Immunizations Administered    Name Date Dose VIS Date Route   Pfizer COVID-19 Vaccine 04/28/2019  2:38 PM 0.3 mL 02/02/2019 Intramuscular   Manufacturer: [place]   Lot: [id number]   [place]: [id number]

## 2019-05-19 ENCOUNTER — Ambulatory Visit: Payer: BC Managed Care – PPO | Attending: Internal Medicine

## 2019-05-19 DIAGNOSIS — Z23 Encounter for immunization: Secondary | ICD-10-CM

## 2019-05-19 NOTE — Progress Notes (Signed)
   Covid-19 Vaccination Clinic  Name:  David Adams    MRN: [id number] DOB: March 27, 1965  05/19/2019  Mr. Reisdorf was observed post Covid-19 immunization for 15 minutes without incident. He was provided with Vaccine Information Sheet and instruction to access the V-Safe system.   Mr. Gagliano was instructed to call 911 with any severe reactions post vaccine: Marland Kitchen Difficulty breathing  . Swelling of face and throat  . A fast heartbeat  . A bad rash all over body  . Dizziness and weakness   Immunizations Administered    Name Date Dose VIS Date Route   Pfizer COVID-19 Vaccine 05/19/2019 12:25 PM 0.3 mL 02/02/2019 Intramuscular   Manufacturer: [place]   Lot: [id number]   [place]: [id number]

## 2019-05-29 ENCOUNTER — Ambulatory Visit: Payer: BLUE CROSS/BLUE SHIELD

## 2019-06-19 DIAGNOSIS — Z20828 Contact with and (suspected) exposure to other viral communicable diseases: Secondary | ICD-10-CM | POA: Diagnosis not present

## 2020-02-21 ENCOUNTER — Telehealth: Payer: Self-pay

## 2020-02-21 NOTE — Telephone Encounter (Signed)
Pt has an appt Monday for possible ear infection. However, He wants to know if there is anything he can be doing now for the pain.

## 2020-02-21 NOTE — Telephone Encounter (Signed)
See below

## 2020-02-22 ENCOUNTER — Ambulatory Visit
Admission: EM | Admit: 2020-02-22 | Discharge: 2020-02-22 | Disposition: A | Discharge status: Home / Self Care | Payer: BC Managed Care – PPO | Attending: Family Medicine | Admitting: Family Medicine

## 2020-02-22 DIAGNOSIS — H9201 Otalgia, right ear: Secondary | ICD-10-CM

## 2020-02-22 DIAGNOSIS — R6884 Jaw pain: Secondary | ICD-10-CM

## 2020-02-22 MED ORDER — FLUTICASONE PROPIONATE 50 MCG/ACT NA SUSP: 2.0000 | Freq: Every day | NASAL | 2 refills | Status: AC

## 2020-02-22 MED ORDER — AMOXICILLIN 500 MG PO CAPS: 1000.0000 mg | ORAL_CAPSULE | Freq: Two times a day (BID) | ORAL | 0 refills | Status: AC

## 2020-02-22 NOTE — ED Triage Notes (Signed)
Pt presents with c/o right ear pain for almost a week , pain extends into jaw

## 2020-02-22 NOTE — Discharge Instructions (Addendum)
Take the antibiotics as prescribed.  Also recommend Flonase nasal spray 2 sprays in each nostril daily You can take Tylenol or ibuprofen as needed Follow up as needed for continued or worsening symptoms

## 2020-02-25 ENCOUNTER — Ambulatory Visit: Payer: BC Managed Care – PPO | Admitting: Family Medicine

## 2020-02-25 NOTE — ED Provider Notes (Signed)
[facility]    CSN: [id number] Arrival date & time: 02/22/20  1533      History   Chief Complaint Chief Complaint  Patient presents with  . Otalgia    HPI David Adams is a 55 y.o. male.   Patient is a 55 year old male presents today with vomiting right ear pain that goes into the neck and jaw area times a week.  Symptoms have seemed to worsen.  No nasal congestion, rhinorrhea, sneezing, cough or chest congestion.  No fevers.     Past Medical History:  Diagnosis Date  . Back pain     Patient Active Problem List   Diagnosis Date Noted  . Dyslipidemia 01/29/2019  . Screening-pulmonary TB 06/30/2016  . Screening examination for infectious disease 06/30/2016    Past Surgical History:  Procedure Laterality Date  . MOUTH SURGERY     no sedation per pt        Home Medications    Prior to Admission medications   Medication Sig Start Date End Date Taking? Authorizing Provider  amoxicillin (AMOXIL) 500 MG capsule Take 2 capsules (1,000 mg total) by mouth 2 (two) times daily for 7 days. 02/22/20 02/29/20 Yes Berwyn Bigley A, NP  fluticasone (FLONASE) 50 MCG/ACT nasal spray Place 2 sprays into both nostrils daily. 02/22/20  Yes Ivoree Felmlee A, NP  Multiple Vitamins-Minerals (MULTIVITAMIN ADULT PO) Take by mouth daily.    [provider]    Family History Family History  Problem Relation Age of Onset  . Cancer Sister   . Thyroid cancer Paternal Grandmother   . Prostate cancer Neg Hx   . Colon cancer Neg Hx   . Colon polyps Neg Hx   . Esophageal cancer Neg Hx   . Rectal cancer Neg Hx     Social History Social History   Tobacco Use  . Smoking status: Never Smoker  . Smokeless tobacco: Never Used  Vaping Use  . Vaping Use: Never used  Substance Use Topics  . Alcohol use: No  . Drug use: No     Allergies   Peanut-containing drug products   Review of Systems Review of Systems   Physical Exam Triage Vital Signs ED Triage  Vitals  Enc Vitals Group     BP 02/22/20 1604 (!) 144/89     Pulse Rate 02/22/20 1604 (!) 57     Resp 02/22/20 1604 18     Temp 02/22/20 1604 98.3 F (36.8 C)     Temp src --      SpO2 02/22/20 1604 97 %     Weight --      Height --      Head Circumference --      Peak Flow --      Pain Score 02/22/20 1603 6     Pain Loc --      Pain Edu? --      Excl. in [place]? --    No data found.  Updated Vital Signs BP (!) 144/89   Pulse (!) 57   Temp 98.3 F (36.8 C)   Resp 18   SpO2 97%   Visual Acuity Right Eye Distance:   Left Eye Distance:   Bilateral Distance:    Right Eye Near:   Left Eye Near:    Bilateral Near:     Physical Exam Vitals and nursing note reviewed.  Constitutional:      Appearance: Normal appearance.  HENT:     Head: Normocephalic and  atraumatic.     Right Ear: No mastoid tenderness. Tympanic membrane is erythematous and retracted.     Left Ear: Tympanic membrane normal.     Nose: Nose normal.     Mouth/Throat:     Comments: No pain with mouth movement or palpation of the TMJ.  No obvious dental infection.  Eyes:     Conjunctiva/sclera: Conjunctivae normal.  Pulmonary:     Effort: Pulmonary effort is normal.  Abdominal:     Palpations: Abdomen is soft.     Tenderness: There is no abdominal tenderness.  Musculoskeletal:        General: Normal range of motion.     Cervical back: Normal range of motion.  Skin:    General: Skin is warm and dry.  Neurological:     Mental Status: He is alert.  Psychiatric:        Mood and Affect: Mood normal.      UC Treatments / Results  Labs (all labs ordered are listed, but only abnormal results are displayed) Labs Reviewed - No data to display  EKG   Radiology No results found.  Procedures Procedures (including critical care time)  Medications Ordered in UC Medications - No data to display  Initial Impression / Assessment and Plan / UC Course  I have reviewed the triage vital signs and the  nursing notes.  Pertinent labs & imaging results that were available during my care of the patient were reviewed by me and considered in my medical decision making (see chart for details).     Ear pain.  Treating for ear infection based on exam. Also treating for Eustachian Tube Dysfunction.  Flonase and amoxicillin as prescribed Tylenol as needed. Follow up as needed for continued or worsening symptoms  Final Clinical Impressions(s) / UC Diagnoses   Final diagnoses:  Ear pain, referred, right  Jaw pain     Discharge Instructions     Take the antibiotics as prescribed.  Also recommend Flonase nasal spray 2 sprays in each nostril daily You can take Tylenol or ibuprofen as needed Follow up as needed for continued or worsening symptoms     ED Prescriptions    Medication Sig Dispense Auth. Provider   amoxicillin (AMOXIL) 500 MG capsule Take 2 capsules (1,000 mg total) by mouth 2 (two) times daily for 7 days. 28 capsule Elfego Giammarino A, NP   fluticasone (FLONASE) 50 MCG/ACT nasal spray Place 2 sprays into both nostrils daily. 16 g Dahlia Byes A, NP     PDMP not reviewed this encounter.   Janace Aris, NP 02/25/20 1636

## 2021-11-16 ENCOUNTER — Encounter: Payer: Self-pay | Admitting: *Deleted

## 2022-02-04 ENCOUNTER — Encounter: Payer: Self-pay | Admitting: *Deleted

## 2022-03-08 ENCOUNTER — Encounter: Payer: Self-pay | Admitting: Gastroenterology
